# Patient Record
Sex: Female | Born: 1995 | Hispanic: Yes | Marital: Single
Health system: Southern US, Community
[De-identification: ages and names within clinical notes are randomized; demographics above are authoritative.]

---

## 2020-11-16 ENCOUNTER — Other Ambulatory Visit: Payer: Self-pay

## 2020-11-16 ENCOUNTER — Encounter (HOSPITAL_BASED_OUTPATIENT_CLINIC_OR_DEPARTMENT_OTHER): Payer: Self-pay | Admitting: *Deleted

## 2020-11-16 ENCOUNTER — Emergency Department (HOSPITAL_BASED_OUTPATIENT_CLINIC_OR_DEPARTMENT_OTHER)
Admission: EM | Admit: 2020-11-16 | Discharge: 2020-11-17 | Disposition: A | Discharge status: Home / Self Care | Payer: Self-pay | Attending: Emergency Medicine | Admitting: Emergency Medicine

## 2020-11-16 ENCOUNTER — Emergency Department (HOSPITAL_BASED_OUTPATIENT_CLINIC_OR_DEPARTMENT_OTHER): Payer: Self-pay

## 2020-11-16 DIAGNOSIS — S20212A Contusion of left front wall of thorax, initial encounter: Secondary | ICD-10-CM | POA: Insufficient documentation

## 2020-11-16 DIAGNOSIS — S161XXA Strain of muscle, fascia and tendon at neck level, initial encounter: Secondary | ICD-10-CM | POA: Diagnosis not present

## 2020-11-16 DIAGNOSIS — S199XXA Unspecified injury of neck, initial encounter: Secondary | ICD-10-CM | POA: Diagnosis present

## 2020-11-16 DIAGNOSIS — R519 Headache, unspecified: Secondary | ICD-10-CM | POA: Insufficient documentation

## 2020-11-16 DIAGNOSIS — Y92410 Unspecified street and highway as the place of occurrence of the external cause: Secondary | ICD-10-CM | POA: Diagnosis not present

## 2020-11-16 MED ORDER — IBUPROFEN 400 MG PO TABS
600.0000 mg | ORAL_TABLET | Freq: Once | ORAL | Status: AC
  Administered 2020-11-16: 600 mg via ORAL
  Filled 2020-11-16: qty 1
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
## 2020-11-16 NOTE — ED Notes (Signed)
Pt removed EMS ccollar and has been carrying it around with her in lobby

## 2020-11-17 MED ORDER — NAPROXEN 375 MG PO TABS: ORAL_TABLET | ORAL | 0 refills | Status: AC
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
